# Patient Record
Sex: Female | Born: 2000 | Race: Black or African American | Hispanic: No | Marital: Single | Smoking: Never smoker
Health system: Southern US, Community
[De-identification: ages and names within clinical notes are randomized; demographics above are authoritative.]

## PROBLEM LIST (undated history)

## (undated) DIAGNOSIS — F329 Major depressive disorder, single episode, unspecified: Secondary | ICD-10-CM

## (undated) DIAGNOSIS — F32A Depression, unspecified: Secondary | ICD-10-CM

## (undated) DIAGNOSIS — F419 Anxiety disorder, unspecified: Secondary | ICD-10-CM

## (undated) HISTORY — DX: Depression, unspecified: F32.A

## (undated) HISTORY — DX: Anxiety disorder, unspecified: F41.9

---
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
## 2021-02-08 IMAGING — CR DG ABDOMEN ACUTE W/ 1V CHEST
3 series · 3 of 3 positions shown · non-contrast
Comparison: None.

CLINICAL DATA: Pain

EXAM:
DG ABDOMEN ACUTE W/ 1V CHEST

[w chest pa]
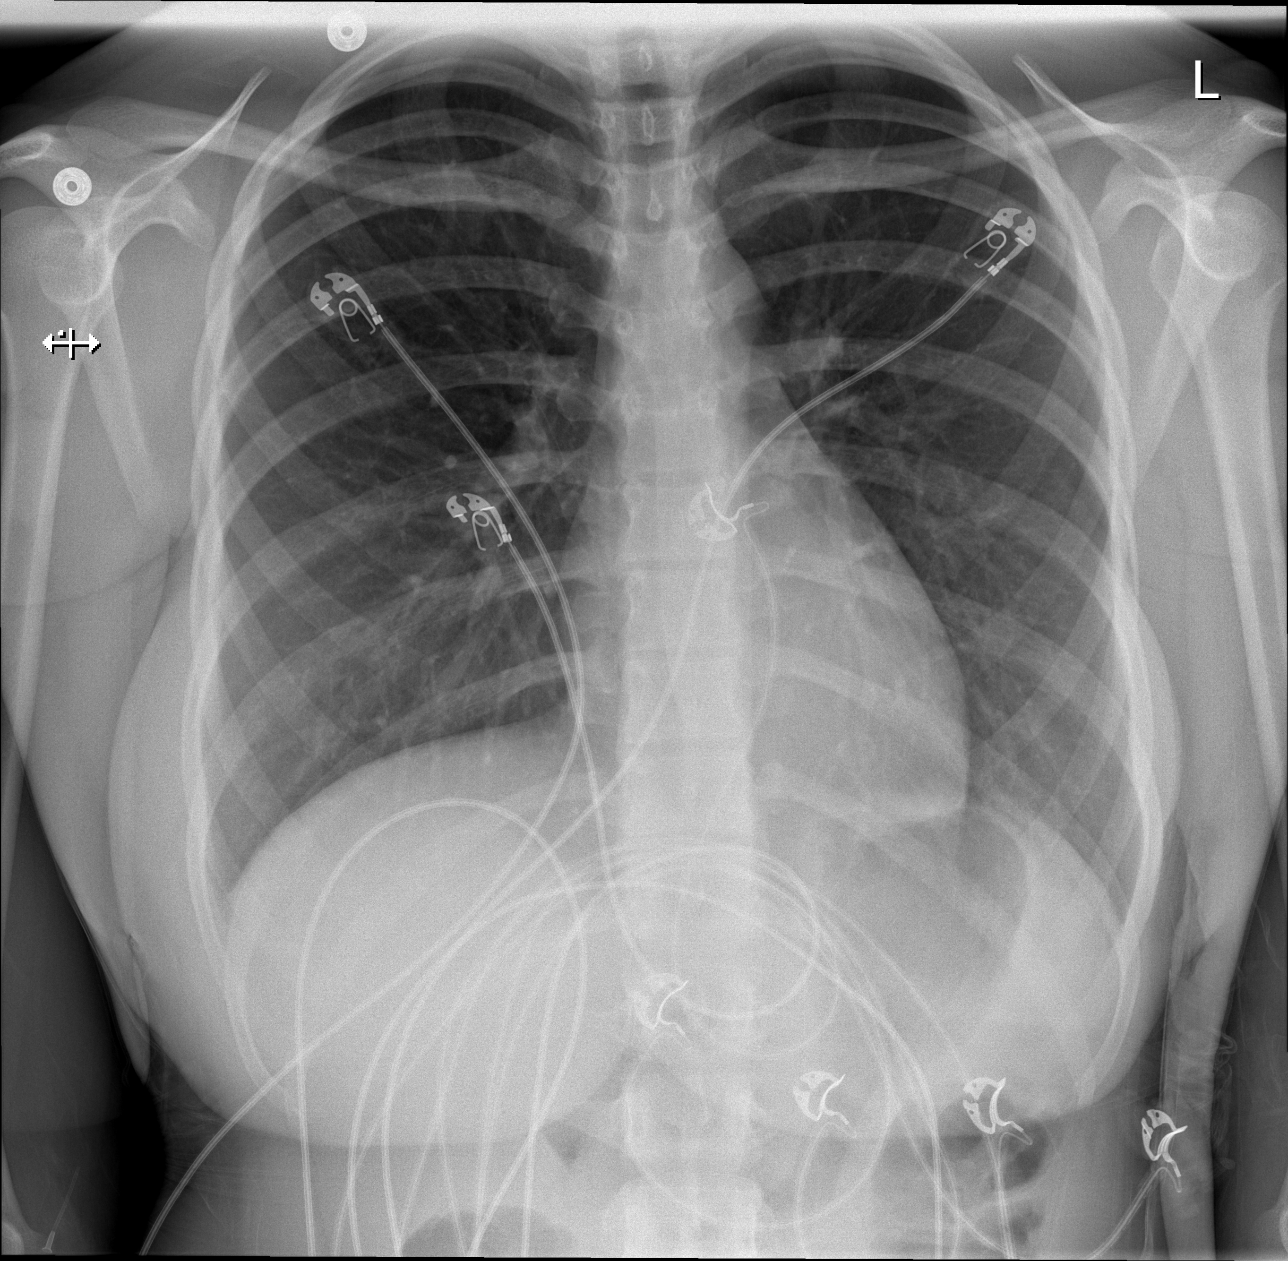

[w abdomen upright]
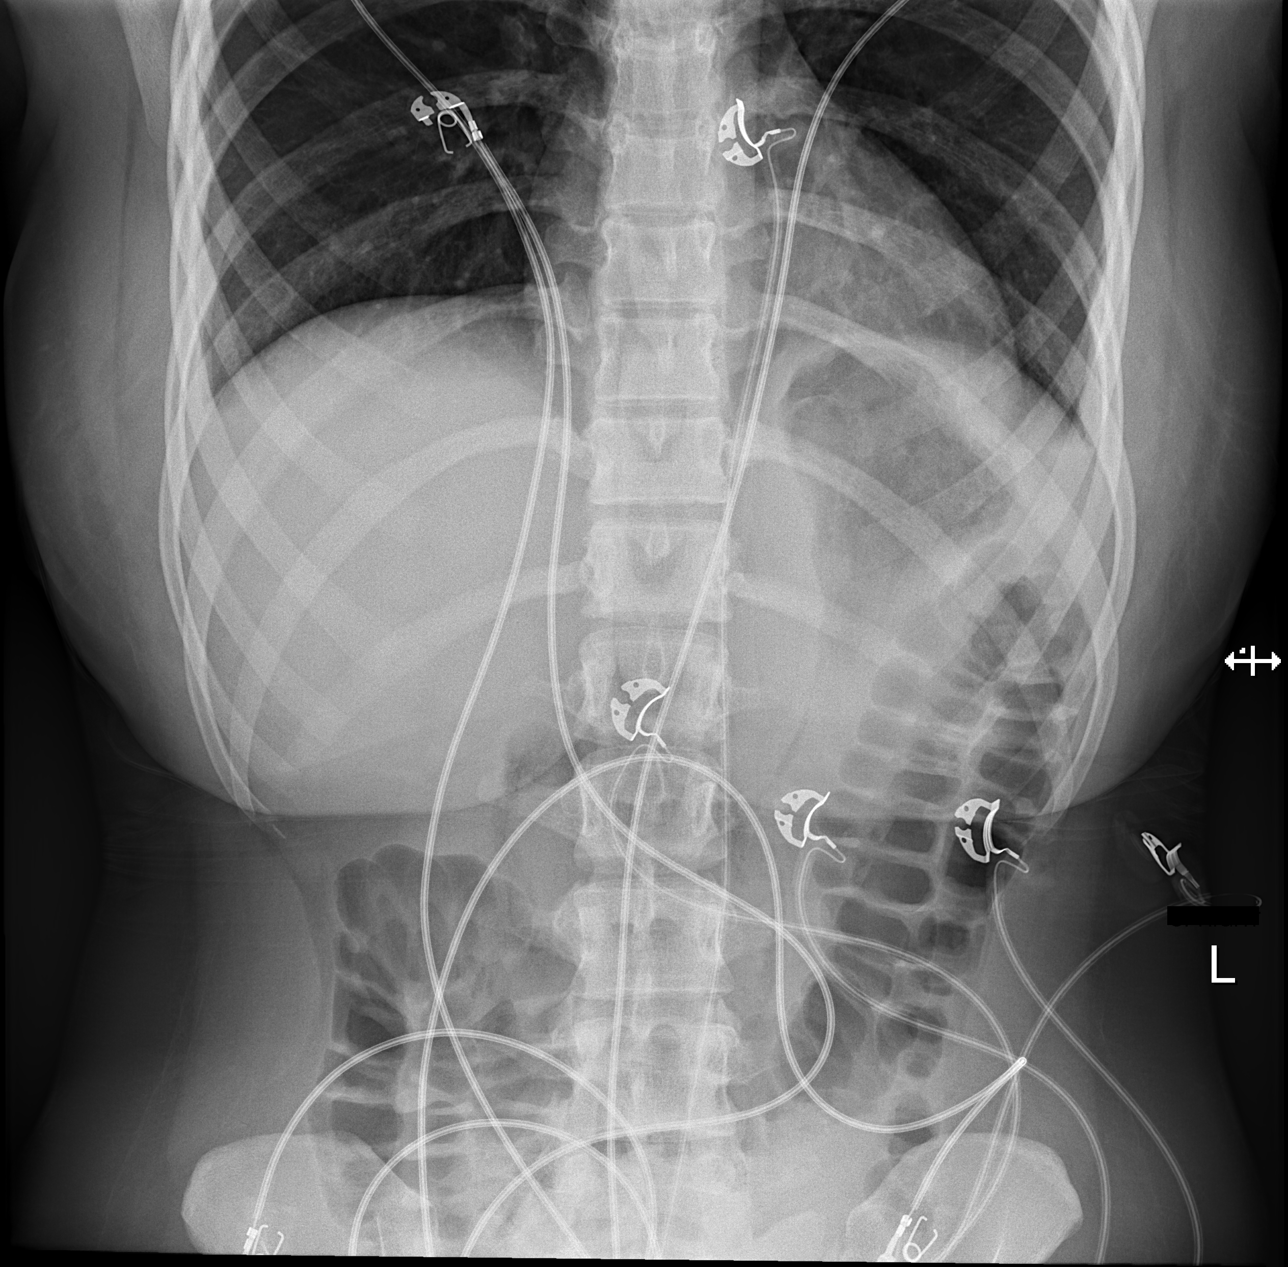

[t abdomen supine]
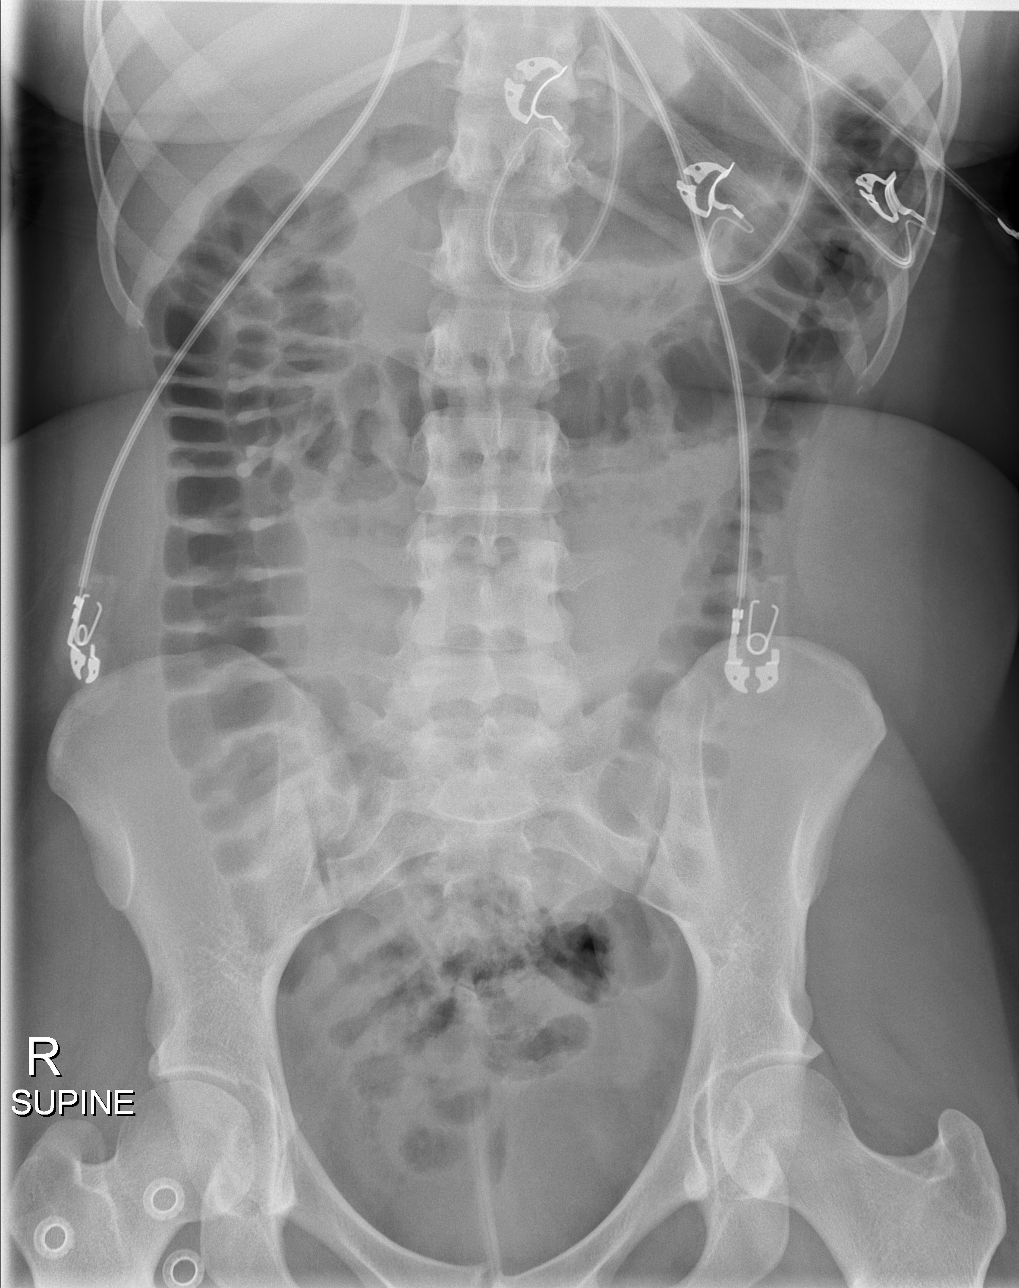

[3 of 3 positions shown; findings below may reference images not displayed]

FINDINGS: There is no evidence of dilated bowel loops or free intraperitoneal
air. No radiopaque calculi or other significant radiographic
abnormality is seen. Heart size and mediastinal contours are within
normal limits. Both lungs are clear.
IMPRESSION: Negative abdominal radiographs.  No acute cardiopulmonary disease.
